# Patient Record
Sex: Female | Born: 1945 | Race: White | Hispanic: No | Marital: Married | Smoking: Never smoker
Health system: Southern US, Community
[De-identification: ages and names within clinical notes are randomized; demographics above are authoritative.]

## PROBLEM LIST (undated history)

## (undated) DIAGNOSIS — I480 Paroxysmal atrial fibrillation: Secondary | ICD-10-CM

## (undated) DIAGNOSIS — M8580 Other specified disorders of bone density and structure, unspecified site: Secondary | ICD-10-CM

## (undated) DIAGNOSIS — E7800 Pure hypercholesterolemia, unspecified: Secondary | ICD-10-CM

## (undated) DIAGNOSIS — E559 Vitamin D deficiency, unspecified: Secondary | ICD-10-CM

## (undated) HISTORY — DX: Pure hypercholesterolemia, unspecified: E78.00

## (undated) HISTORY — DX: Paroxysmal atrial fibrillation: I48.0

## (undated) HISTORY — DX: Other specified disorders of bone density and structure, unspecified site: M85.80

## (undated) HISTORY — DX: Vitamin D deficiency, unspecified: E55.9

---

## 1998-06-16 ENCOUNTER — Other Ambulatory Visit: Admission: RE | Admit: 1998-06-16 | Discharge: 1998-06-16 | Payer: Self-pay | Admitting: *Deleted

## 1998-12-18 ENCOUNTER — Other Ambulatory Visit: Admission: RE | Admit: 1998-12-18 | Discharge: 1998-12-18 | Payer: Self-pay | Admitting: *Deleted

## 1999-11-03 ENCOUNTER — Encounter: Payer: Self-pay | Admitting: *Deleted

## 1999-11-03 ENCOUNTER — Ambulatory Visit (HOSPITAL_COMMUNITY): Admission: RE | Admit: 1999-11-03 | Discharge: 1999-11-03 | Payer: Self-pay | Admitting: *Deleted

## 1999-11-16 ENCOUNTER — Other Ambulatory Visit: Admission: RE | Admit: 1999-11-16 | Discharge: 1999-11-16 | Payer: Self-pay | Admitting: *Deleted

## 2001-02-05 ENCOUNTER — Other Ambulatory Visit: Admission: RE | Admit: 2001-02-05 | Discharge: 2001-02-05 | Payer: Self-pay | Admitting: *Deleted

## 2001-07-02 ENCOUNTER — Ambulatory Visit (HOSPITAL_COMMUNITY): Admission: RE | Admit: 2001-07-02 | Discharge: 2001-07-02 | Payer: Self-pay | Admitting: *Deleted

## 2001-07-02 ENCOUNTER — Encounter: Payer: Self-pay | Admitting: *Deleted

## 2002-01-25 ENCOUNTER — Other Ambulatory Visit: Admission: RE | Admit: 2002-01-25 | Discharge: 2002-01-25 | Payer: Self-pay | Admitting: *Deleted

## 2004-02-17 ENCOUNTER — Other Ambulatory Visit: Admission: RE | Admit: 2004-02-17 | Discharge: 2004-02-17 | Payer: Self-pay | Admitting: Obstetrics & Gynecology

## 2004-02-17 ENCOUNTER — Ambulatory Visit (HOSPITAL_COMMUNITY): Admission: RE | Admit: 2004-02-17 | Discharge: 2004-02-17 | Payer: Self-pay | Admitting: Obstetrics & Gynecology

## 2006-06-20 ENCOUNTER — Ambulatory Visit (HOSPITAL_COMMUNITY): Admission: RE | Admit: 2006-06-20 | Discharge: 2006-06-20 | Payer: Self-pay | Admitting: Obstetrics & Gynecology

## 2007-08-30 ENCOUNTER — Ambulatory Visit (HOSPITAL_COMMUNITY): Admission: RE | Admit: 2007-08-30 | Discharge: 2007-08-30 | Payer: Self-pay | Admitting: Obstetrics & Gynecology

## 2008-09-10 ENCOUNTER — Ambulatory Visit (HOSPITAL_COMMUNITY): Admission: RE | Admit: 2008-09-10 | Discharge: 2008-09-10 | Payer: Self-pay | Admitting: Obstetrics & Gynecology

## 2010-06-24 ENCOUNTER — Ambulatory Visit (HOSPITAL_COMMUNITY): Admission: RE | Admit: 2010-06-24 | Discharge: 2010-06-24 | Payer: Self-pay | Admitting: Obstetrics & Gynecology

## 2012-08-23 ENCOUNTER — Other Ambulatory Visit (HOSPITAL_COMMUNITY): Payer: Self-pay | Admitting: Internal Medicine

## 2012-08-23 DIAGNOSIS — Z1231 Encounter for screening mammogram for malignant neoplasm of breast: Secondary | ICD-10-CM

## 2012-08-28 DIAGNOSIS — Z1151 Encounter for screening for human papillomavirus (HPV): Secondary | ICD-10-CM | POA: Diagnosis not present

## 2012-08-28 DIAGNOSIS — Z124 Encounter for screening for malignant neoplasm of cervix: Secondary | ICD-10-CM | POA: Diagnosis not present

## 2012-08-28 DIAGNOSIS — M949 Disorder of cartilage, unspecified: Secondary | ICD-10-CM | POA: Diagnosis not present

## 2012-08-28 DIAGNOSIS — M899 Disorder of bone, unspecified: Secondary | ICD-10-CM | POA: Diagnosis not present

## 2012-08-28 DIAGNOSIS — Z01419 Encounter for gynecological examination (general) (routine) without abnormal findings: Secondary | ICD-10-CM | POA: Diagnosis not present

## 2012-08-28 DIAGNOSIS — N951 Menopausal and female climacteric states: Secondary | ICD-10-CM | POA: Diagnosis not present

## 2012-09-11 ENCOUNTER — Ambulatory Visit (HOSPITAL_COMMUNITY)
Admission: RE | Admit: 2012-09-11 | Discharge: 2012-09-11 | Disposition: A | Discharge status: Home / Self Care | Payer: Medicare Other | Source: Ambulatory Visit | Attending: Internal Medicine | Admitting: Internal Medicine

## 2012-09-11 DIAGNOSIS — Z1231 Encounter for screening mammogram for malignant neoplasm of breast: Secondary | ICD-10-CM | POA: Insufficient documentation

## 2012-11-20 DIAGNOSIS — K5520 Angiodysplasia of colon without hemorrhage: Secondary | ICD-10-CM | POA: Diagnosis not present

## 2012-11-20 DIAGNOSIS — K648 Other hemorrhoids: Secondary | ICD-10-CM | POA: Diagnosis not present

## 2012-11-20 DIAGNOSIS — Z1211 Encounter for screening for malignant neoplasm of colon: Secondary | ICD-10-CM | POA: Diagnosis not present

## 2013-04-30 DIAGNOSIS — D485 Neoplasm of uncertain behavior of skin: Secondary | ICD-10-CM | POA: Diagnosis not present

## 2013-04-30 DIAGNOSIS — L820 Inflamed seborrheic keratosis: Secondary | ICD-10-CM | POA: Diagnosis not present

## 2013-07-18 DIAGNOSIS — L570 Actinic keratosis: Secondary | ICD-10-CM | POA: Diagnosis not present

## 2013-07-18 DIAGNOSIS — D235 Other benign neoplasm of skin of trunk: Secondary | ICD-10-CM | POA: Diagnosis not present

## 2013-07-30 DIAGNOSIS — Z23 Encounter for immunization: Secondary | ICD-10-CM | POA: Diagnosis not present

## 2013-08-28 DIAGNOSIS — L219 Seborrheic dermatitis, unspecified: Secondary | ICD-10-CM | POA: Diagnosis not present

## 2013-08-28 DIAGNOSIS — L570 Actinic keratosis: Secondary | ICD-10-CM | POA: Diagnosis not present

## 2014-05-07 DIAGNOSIS — H02839 Dermatochalasis of unspecified eye, unspecified eyelid: Secondary | ICD-10-CM | POA: Diagnosis not present

## 2014-05-07 DIAGNOSIS — H2510 Age-related nuclear cataract, unspecified eye: Secondary | ICD-10-CM | POA: Diagnosis not present

## 2014-05-16 DIAGNOSIS — T148 Other injury of unspecified body region: Secondary | ICD-10-CM | POA: Diagnosis not present

## 2014-05-16 DIAGNOSIS — W57XXXA Bitten or stung by nonvenomous insect and other nonvenomous arthropods, initial encounter: Secondary | ICD-10-CM | POA: Diagnosis not present

## 2014-05-16 DIAGNOSIS — A7740 Ehrlichiosis, unspecified: Secondary | ICD-10-CM | POA: Diagnosis not present

## 2014-07-16 DIAGNOSIS — D235 Other benign neoplasm of skin of trunk: Secondary | ICD-10-CM | POA: Diagnosis not present

## 2014-07-16 DIAGNOSIS — L819 Disorder of pigmentation, unspecified: Secondary | ICD-10-CM | POA: Diagnosis not present

## 2014-08-11 DIAGNOSIS — Z23 Encounter for immunization: Secondary | ICD-10-CM | POA: Diagnosis not present

## 2015-07-22 DIAGNOSIS — L821 Other seborrheic keratosis: Secondary | ICD-10-CM | POA: Diagnosis not present

## 2015-07-22 DIAGNOSIS — L820 Inflamed seborrheic keratosis: Secondary | ICD-10-CM | POA: Diagnosis not present

## 2015-07-22 DIAGNOSIS — L814 Other melanin hyperpigmentation: Secondary | ICD-10-CM | POA: Diagnosis not present

## 2015-07-22 DIAGNOSIS — D225 Melanocytic nevi of trunk: Secondary | ICD-10-CM | POA: Diagnosis not present

## 2015-07-22 DIAGNOSIS — L570 Actinic keratosis: Secondary | ICD-10-CM | POA: Diagnosis not present

## 2015-07-30 DIAGNOSIS — Z1231 Encounter for screening mammogram for malignant neoplasm of breast: Secondary | ICD-10-CM | POA: Diagnosis not present

## 2015-07-30 DIAGNOSIS — Z803 Family history of malignant neoplasm of breast: Secondary | ICD-10-CM | POA: Diagnosis not present

## 2015-08-12 ENCOUNTER — Emergency Department (HOSPITAL_COMMUNITY)
Admission: EM | Admit: 2015-08-12 | Discharge: 2015-08-12 | Disposition: A | Discharge status: Home / Self Care | Payer: Medicare Other | Attending: Emergency Medicine | Admitting: Emergency Medicine

## 2015-08-12 ENCOUNTER — Emergency Department (HOSPITAL_COMMUNITY): Payer: Medicare Other

## 2015-08-12 ENCOUNTER — Encounter (HOSPITAL_COMMUNITY): Payer: Self-pay | Admitting: *Deleted

## 2015-08-12 DIAGNOSIS — Y9389 Activity, other specified: Secondary | ICD-10-CM | POA: Insufficient documentation

## 2015-08-12 DIAGNOSIS — Y9289 Other specified places as the place of occurrence of the external cause: Secondary | ICD-10-CM | POA: Diagnosis not present

## 2015-08-12 DIAGNOSIS — Y998 Other external cause status: Secondary | ICD-10-CM | POA: Insufficient documentation

## 2015-08-12 DIAGNOSIS — W108XXA Fall (on) (from) other stairs and steps, initial encounter: Secondary | ICD-10-CM | POA: Diagnosis not present

## 2015-08-12 DIAGNOSIS — M7989 Other specified soft tissue disorders: Secondary | ICD-10-CM | POA: Diagnosis not present

## 2015-08-12 DIAGNOSIS — S5011XA Contusion of right forearm, initial encounter: Secondary | ICD-10-CM | POA: Diagnosis not present

## 2015-08-12 DIAGNOSIS — S59911A Unspecified injury of right forearm, initial encounter: Secondary | ICD-10-CM | POA: Diagnosis present

## 2015-08-12 NOTE — Discharge Instructions (Signed)
Contusion A contusion is a deep bruise. Contusions are the result of a blunt injury to tissues and muscle fibers under the skin. The injury causes bleeding under the skin. The skin overlying the contusion may turn blue, purple, or yellow. Minor injuries will give you a painless contusion, but more severe contusions may stay painful and swollen for a few weeks.  CAUSES  This condition is usually caused by a blow, trauma, or direct force to an area of the body. SYMPTOMS  Symptoms of this condition include:  Swelling of the injured area.  Pain and tenderness in the injured area.  Discoloration. The area may have redness and then turn blue, purple, or yellow. DIAGNOSIS  This condition is diagnosed based on a physical exam and medical history. An X-ray, CT scan, or MRI may be needed to determine if there are any associated injuries, such as broken bones (fractures). TREATMENT  Specific treatment for this condition depends on what area of the body was injured. In general, the best treatment for a contusion is resting, icing, applying pressure to (compression), and elevating the injured area. This is often called the RICE strategy. Over-the-counter anti-inflammatory medicines may also be recommended for pain control.  HOME CARE INSTRUCTIONS   Rest the injured area.  If directed, apply ice to the injured area:  Put ice in a plastic bag.  Place a towel between your skin and the bag.  Leave the ice on for 20 minutes, 2-3 times per day.  If directed, apply light compression to the injured area using an elastic bandage. Make sure the bandage is not wrapped too tightly. Remove and reapply the bandage as directed by your health care provider.  If possible, raise (elevate) the injured area above the level of your heart while you are sitting or lying down.  Take over-the-counter and prescription medicines only as told by your health care provider. SEEK MEDICAL CARE IF:  Your symptoms do not  improve after several days of treatment.  Your symptoms get worse.  You have difficulty moving the injured area. SEEK IMMEDIATE MEDICAL CARE IF:   You have severe pain.  You have numbness in a hand or foot.  Your hand or foot turns pale or cold.   This information is not intended to replace advice given to you by your health care provider. Make sure you discuss any questions you have with your health care provider.   Document Released: 07/20/2005 Document Revised: 07/01/2015 Document Reviewed: 02/25/2015 Elsevier Interactive Patient Education 2016 [employer] Hematoma A hematoma is a collection of blood under the skin, in an organ, in a body space, in a joint space, or in other tissue. The blood can clot to form a lump that you can see and feel. The lump is often firm and may sometimes become sore and tender. Most hematomas get better in a few days to weeks. However, some hematomas may be serious and require medical care. Hematomas can range in size from very small to very large. CAUSES  A hematoma can be caused by a blunt or penetrating injury. It can also be caused by spontaneous leakage from a blood vessel under the skin. Spontaneous leakage from a blood vessel is more likely to occur in older people, especially those taking blood thinners. Sometimes, a hematoma can develop after certain medical procedures. SIGNS AND SYMPTOMS   A firm lump on the body.  Possible pain and tenderness in the area.  Bruising.Blue, dark blue, purple-red, or yellowish skin may appear  at the site of the hematoma if the hematoma is close to the surface of the skin. For hematomas in deeper tissues or body spaces, the signs and symptoms may be subtle. For example, an intra-abdominal hematoma may cause abdominal pain, weakness, fainting, and shortness of breath. An intracranial hematoma may cause a headache or symptoms such as weakness, trouble speaking, or a change in consciousness. DIAGNOSIS  A hematoma  can usually be diagnosed based on your medical history and a physical exam. Imaging tests may be needed if your health care provider suspects a hematoma in deeper tissues or body spaces, such as the abdomen, head, or chest. These tests may include ultrasonography or a CT scan.  TREATMENT  Hematomas usually go away on their own over time. Rarely does the blood need to be drained out of the body. Large hematomas or those that may affect vital organs will sometimes need surgical drainage or monitoring. HOME CARE INSTRUCTIONS   Apply ice to the injured area:   Put ice in a plastic bag.   Place a towel between your skin and the bag.   Leave the ice on for 20 minutes, 2-3 times a day for the first 1 to 2 days.   After the first 2 days, switch to using warm compresses on the hematoma.   Elevate the injured area to help decrease pain and swelling. Wrapping the area with an elastic bandage may also be helpful. Compression helps to reduce swelling and promotes shrinking of the hematoma. Make sure the bandage is not wrapped too tight.   If your hematoma is on a lower extremity and is painful, crutches may be helpful for a couple days.   Only take over-the-counter or prescription medicines as directed by your health care provider. SEEK IMMEDIATE MEDICAL CARE IF:   You have increasing pain, or your pain is not controlled with medicine.   You have a fever.   You have worsening swelling or discoloration.   Your skin over the hematoma breaks or starts bleeding.   Your hematoma is in your chest or abdomen and you have weakness, shortness of breath, or a change in consciousness.  Your hematoma is on your scalp (caused by a fall or injury) and you have a worsening headache or a change in alertness or consciousness. MAKE SURE YOU:   Understand these instructions.  Will watch your condition.  Will get help right away if you are not doing well or get worse.   This information is not  intended to replace advice given to you by your health care provider. Make sure you discuss any questions you have with your health care provider.   Document Released: 05/24/2004 Document Revised: 06/12/2013 Document Reviewed: 03/20/2013 Elsevier Interactive Patient Education [employer].

## 2015-08-12 NOTE — ED Notes (Signed)
Pt reports no medical hx, but has a tendency to bruise. Pt was sitting on outside steps, when to stand up, heel slipped. Fell to the right and right forearm landed on brick step. Pt now has large purple hematoma, egg sized, to upper right forearm near elbow. Pain 2/10. Does not take aspirin. Able to wiggle all fingers. Denies numbness or tingling in hand.

## 2015-08-12 NOTE — ED Notes (Signed)
Patient transported to X-ray 

## 2015-08-12 NOTE — ED Provider Notes (Signed)
CSN: [id number]     Arrival date & time 08/12/15  1555 History   First MD Initiated Contact with Patient 08/12/15 1615     Chief Complaint  Patient presents with  . Fall  . Arm Injury   Patient is a 69 y.o. female presenting with fall and arm injury. The history is provided by the patient.  Fall This is a new problem. The current episode started less than 1 hour ago. Pertinent negatives include no chest pain and no headaches.  Arm Injury Pain details:    Quality:  Dull   Radiates to:  R arm and R forearm   Severity:  Mild   Onset quality:  Sudden   Timing:  Constant Chronicity:  New Dislocation: no   Foreign body present:  No foreign bodies Relieved by:  Nothing Worsened by:  Nothing tried Associated symptoms: swelling   Associated symptoms: no back pain, no fever, no muscle weakness, no neck pain and no stiffness    the patient fell while walking down the steps. Her heel slipped and her right forearm struck the edge of a brick step. Patient developed a large hematoma on the right upper forearm. She denies any other injuries. No numbness or weakness. No neck pain or headache.   History reviewed. No pertinent past medical history. History reviewed. No pertinent past surgical history. History reviewed. No pertinent family history. Social History  Substance Use Topics  . Smoking status: Never Smoker   . Smokeless tobacco: None  . Alcohol Use: No   OB History    No data available     Review of Systems  Constitutional: Negative for fever.  Cardiovascular: Negative for chest pain.  Gastrointestinal: Negative for vomiting.  Musculoskeletal: Negative for back pain, stiffness, neck pain and neck stiffness.  Neurological: Negative for headaches.      Allergies  Review of patient's allergies indicates no known allergies.  Home Medications   Prior to Admission medications   Not on File   BP 154/74 mmHg  Pulse 75  Temp(Src) 98.3 F (36.8 C) (Oral)  Resp 20  Ht 5\' 3"   (1.6 m)  Wt 123 lb (55.792 kg)  BMI 21.79 kg/m2  SpO2 100% Physical Exam  Constitutional: She appears well-developed and well-nourished. No distress.  HENT:  Head: Normocephalic and atraumatic.  Right Ear: External ear normal.  Left Ear: External ear normal.  Eyes: Conjunctivae are normal. Right eye exhibits no discharge. Left eye exhibits no discharge. No scleral icterus.  Neck: Neck supple. No tracheal deviation present.  Cardiovascular: Normal rate.   Pulmonary/Chest: Effort normal. No stridor. No respiratory distress.  Musculoskeletal:       Right forearm: She exhibits swelling, edema and deformity.  Large hematoma proximal right forearm, no laceration, small skin abrasion, full range of motion of elbow, no shoulder tenderness, no wrist tenderness, distal neurovascular intact, strong pulse  Neurological: She is alert. Cranial nerve deficit: no gross deficits.  Skin: Skin is warm and dry. No rash noted.  Psychiatric: She has a normal mood and affect.  Nursing note and vitals reviewed.   ED Course  Procedures (including critical care time)   Imaging Review Dg Elbow Complete Right  08/12/2015  CLINICAL DATA:  Fall, right forearm hematoma EXAM: RIGHT ELBOW - COMPLETE 3+ VIEW COMPARISON:  None FINDINGS: Four views of the right elbow submitted. No acute fracture or subluxation. No posterior fat pad sign. Soft tissue swelling noted dorsal aspect proximal forearm. IMPRESSION: No acute fracture or subluxation. Soft  tissue swelling noted dorsal aspect proximal forearm. Electronically Signed   By: Lahoma Crocker M.D.   On: 08/12/2015 16:57   Dg Forearm Right  08/12/2015  CLINICAL DATA:  Pt was sitting on outside steps, went to stand up, heel slipped. Fell to the right and right forearm landed on brick step. Pt now has large purple hematoma, egg sized, to upper right forearm near elbow. Pain around upper forearm and elbow. Initial encounter. EXAM: RIGHT FOREARM - 2 VIEW COMPARISON:  Elbow films  of same date FINDINGS: Soft tissue swelling about the proximal ulna is moderate. No acute fracture or dislocation. No elbow joint effusion. IMPRESSION: Soft tissue swelling, without acute osseous finding. Electronically Signed   By: Abigail Miyamoto M.D.   On: 08/12/2015 17:03   I have personally reviewed and evaluated these images and lab results as part of my medical decision-making.    MDM   Final diagnoses:  Traumatic hematoma of forearm, right, initial encounter    Patient has a large hematoma of her right forearm. The x-rays were reviewed with the patient. There is no evidence of fracture. Hematoma does appear slightly larger in size than  initially. Compartments are soft.  Full range of motion in her wrist and fingers.  Strong radial pulse with normal sensation.  Doubt compartment syndrome.  Ice, and ace wrap.   Would not drain the hematoma at this time.  Family will follow up with orthopedist.     Dorie Rank, MD 08/12/15 1739

## 2015-08-12 NOTE — ED Notes (Signed)
MD at bedside. 

## 2015-08-14 DIAGNOSIS — M25521 Pain in right elbow: Secondary | ICD-10-CM | POA: Diagnosis not present

## 2015-08-19 DIAGNOSIS — Z23 Encounter for immunization: Secondary | ICD-10-CM | POA: Diagnosis not present

## 2015-08-31 DIAGNOSIS — T148 Other injury of unspecified body region: Secondary | ICD-10-CM | POA: Diagnosis not present

## 2015-08-31 DIAGNOSIS — E7800 Pure hypercholesterolemia, unspecified: Secondary | ICD-10-CM | POA: Diagnosis not present

## 2015-08-31 DIAGNOSIS — Z1389 Encounter for screening for other disorder: Secondary | ICD-10-CM | POA: Diagnosis not present

## 2015-08-31 DIAGNOSIS — Z01419 Encounter for gynecological examination (general) (routine) without abnormal findings: Secondary | ICD-10-CM | POA: Diagnosis not present

## 2015-08-31 DIAGNOSIS — Z0000 Encounter for general adult medical examination without abnormal findings: Secondary | ICD-10-CM | POA: Diagnosis not present

## 2015-08-31 DIAGNOSIS — M8588 Other specified disorders of bone density and structure, other site: Secondary | ICD-10-CM | POA: Diagnosis not present

## 2015-08-31 DIAGNOSIS — Z124 Encounter for screening for malignant neoplasm of cervix: Secondary | ICD-10-CM | POA: Diagnosis not present

## 2015-12-20 IMAGING — CR DG ELBOW COMPLETE 3+V*R*
4 series · 4 of 4 positions shown · non-contrast
Comparison: None

CLINICAL DATA: Fall, right forearm hematoma

EXAM:
RIGHT ELBOW - COMPLETE 3+ VIEW

[x elbow ap right]
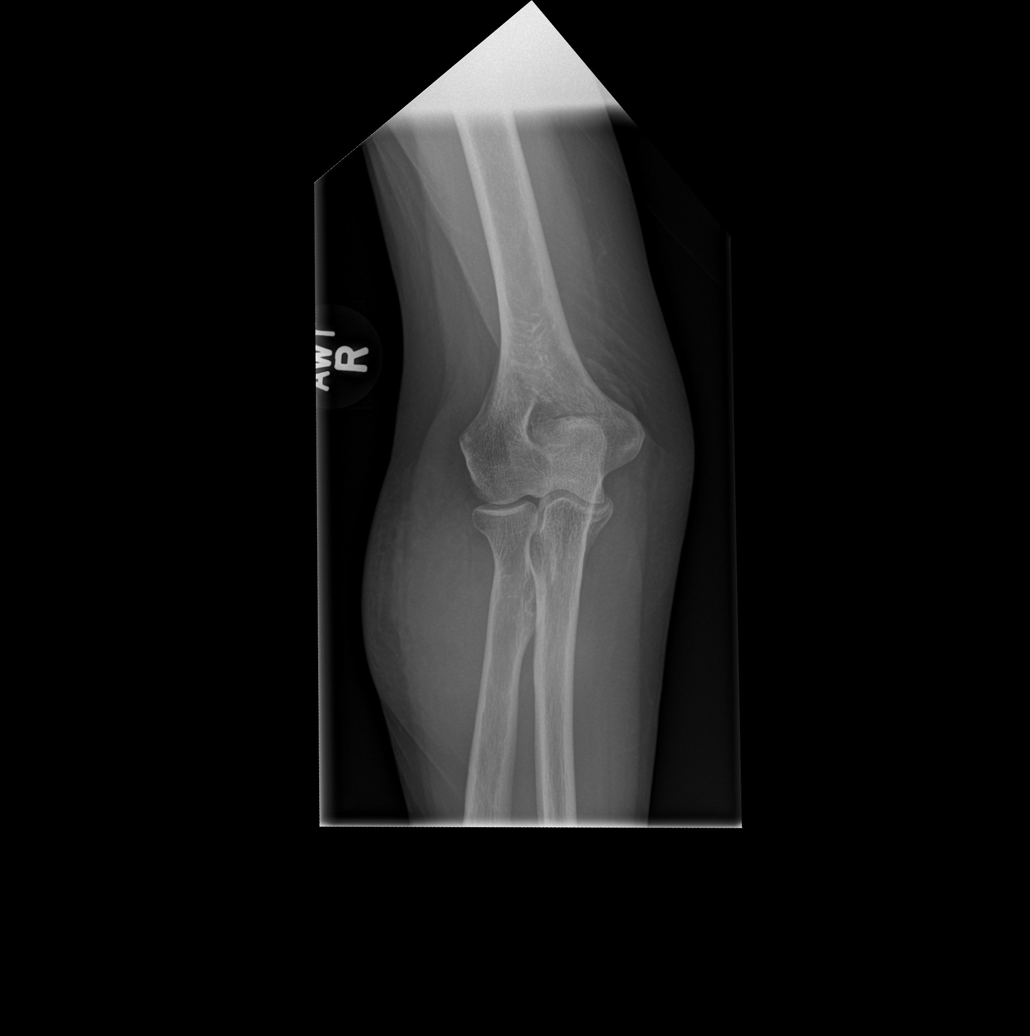

[x elbow obl right (1 of 2)]
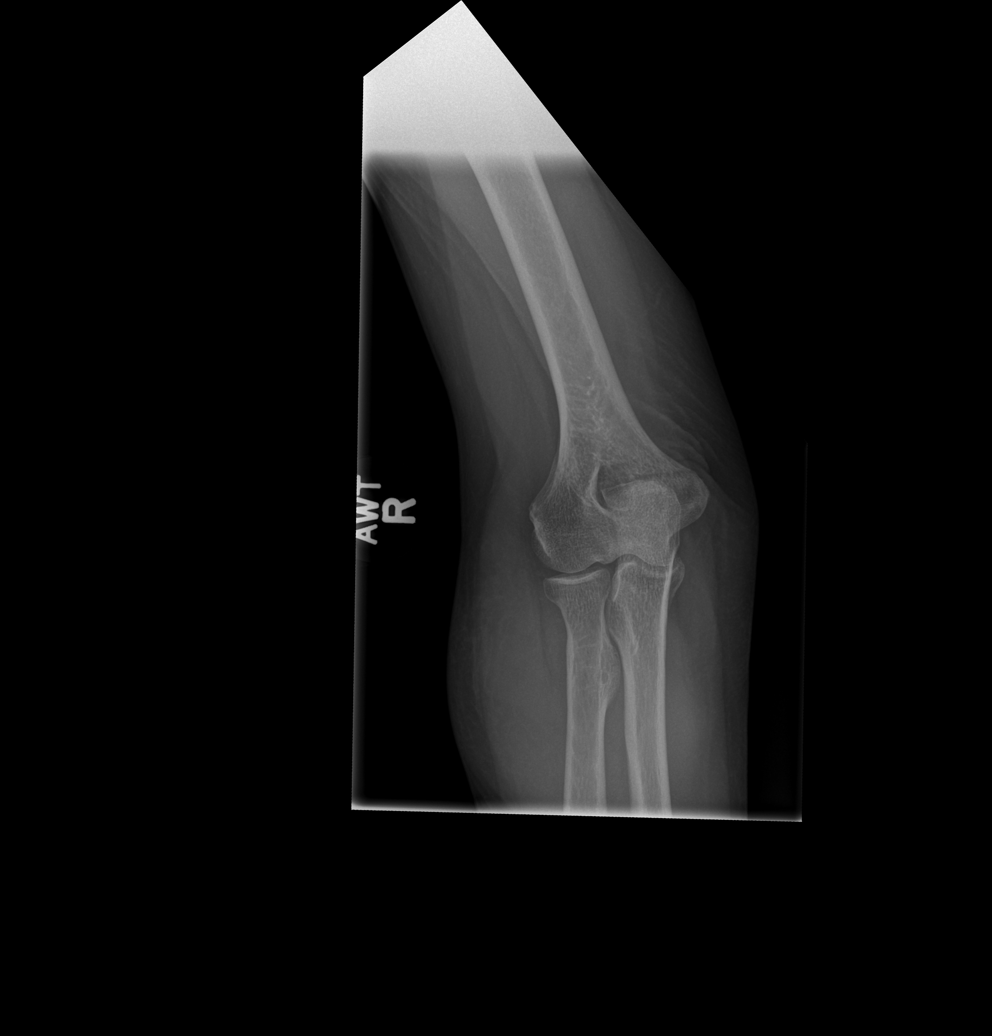

[x elbow obl right (2 of 2)]
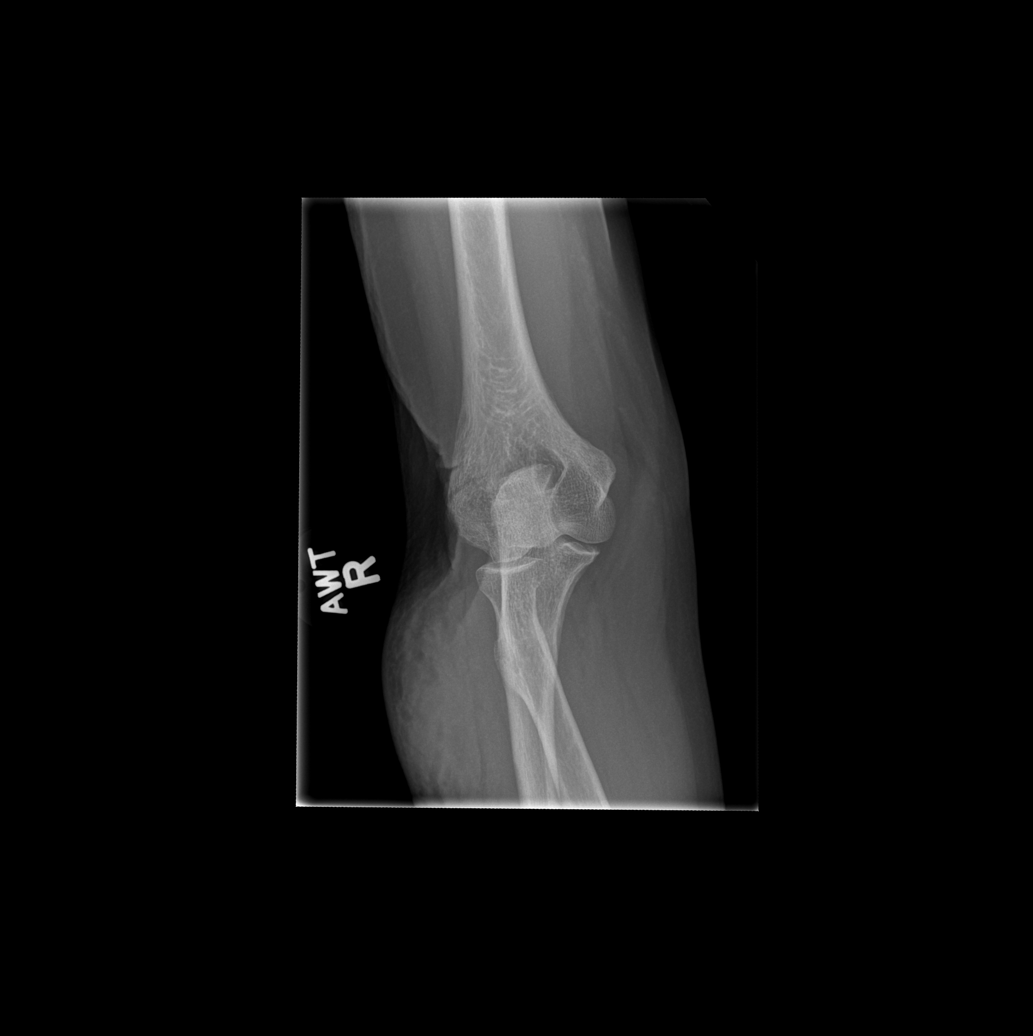

[x elbow lat right]
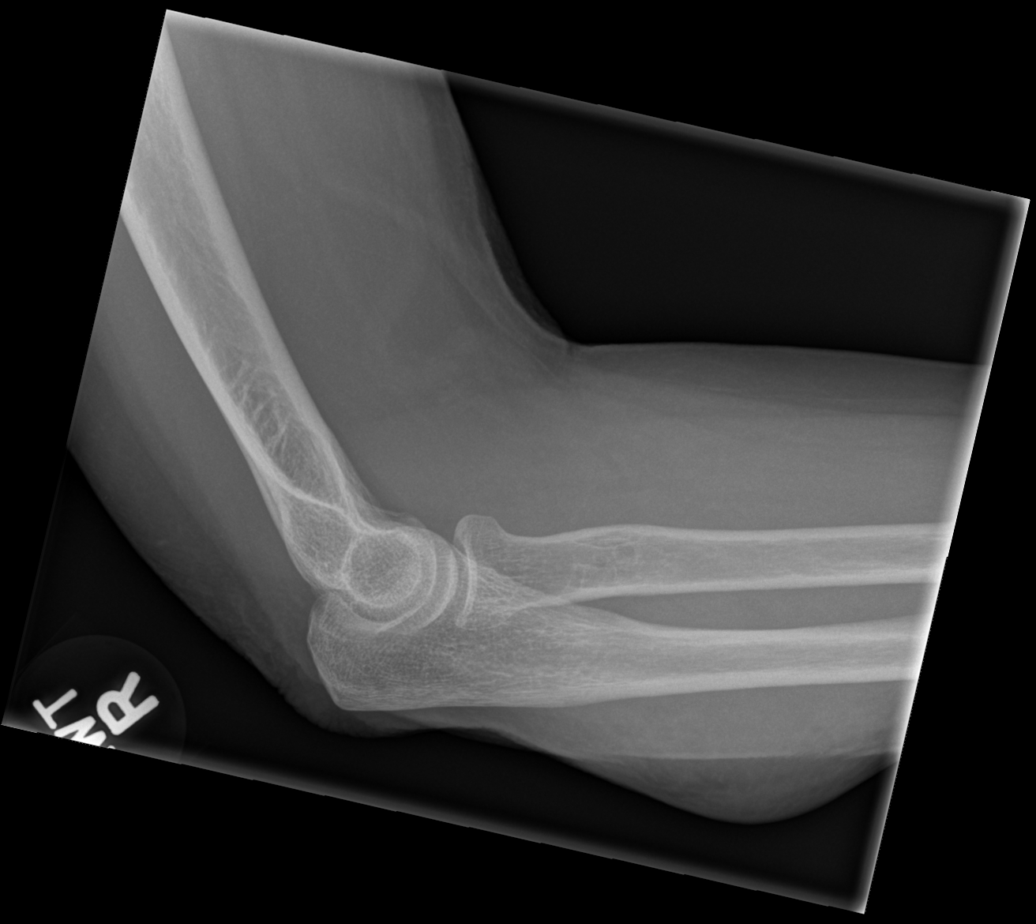

[4 of 4 positions shown; findings below may reference images not displayed]

FINDINGS: Four views of the right elbow submitted. No acute fracture or
subluxation. No posterior fat pad sign. Soft tissue swelling noted
dorsal aspect proximal forearm.
IMPRESSION: No acute fracture or subluxation. Soft tissue swelling noted dorsal
aspect proximal forearm.

## 2016-05-13 DIAGNOSIS — H2513 Age-related nuclear cataract, bilateral: Secondary | ICD-10-CM | POA: Diagnosis not present

## 2016-07-25 DIAGNOSIS — Z23 Encounter for immunization: Secondary | ICD-10-CM | POA: Diagnosis not present

## 2017-07-25 DIAGNOSIS — Z23 Encounter for immunization: Secondary | ICD-10-CM | POA: Diagnosis not present

## 2017-12-25 DIAGNOSIS — E7800 Pure hypercholesterolemia, unspecified: Secondary | ICD-10-CM | POA: Diagnosis not present

## 2017-12-25 DIAGNOSIS — Z1389 Encounter for screening for other disorder: Secondary | ICD-10-CM | POA: Diagnosis not present

## 2017-12-25 DIAGNOSIS — Z23 Encounter for immunization: Secondary | ICD-10-CM | POA: Diagnosis not present

## 2017-12-25 DIAGNOSIS — Z1231 Encounter for screening mammogram for malignant neoplasm of breast: Secondary | ICD-10-CM | POA: Diagnosis not present

## 2017-12-25 DIAGNOSIS — Z0000 Encounter for general adult medical examination without abnormal findings: Secondary | ICD-10-CM | POA: Diagnosis not present

## 2017-12-25 DIAGNOSIS — M8588 Other specified disorders of bone density and structure, other site: Secondary | ICD-10-CM | POA: Diagnosis not present

## 2017-12-25 DIAGNOSIS — R636 Underweight: Secondary | ICD-10-CM | POA: Diagnosis not present

## 2017-12-25 DIAGNOSIS — Z7189 Other specified counseling: Secondary | ICD-10-CM | POA: Diagnosis not present

## 2017-12-25 DIAGNOSIS — Z1239 Encounter for other screening for malignant neoplasm of breast: Secondary | ICD-10-CM | POA: Diagnosis not present

## 2018-04-23 DIAGNOSIS — M5432 Sciatica, left side: Secondary | ICD-10-CM | POA: Diagnosis not present

## 2018-06-28 DIAGNOSIS — Z23 Encounter for immunization: Secondary | ICD-10-CM | POA: Diagnosis not present

## 2019-06-25 DIAGNOSIS — Z23 Encounter for immunization: Secondary | ICD-10-CM | POA: Diagnosis not present

## 2020-02-27 ENCOUNTER — Ambulatory Visit: Payer: Medicare Other | Attending: Internal Medicine

## 2020-02-27 DIAGNOSIS — Z23 Encounter for immunization: Secondary | ICD-10-CM

## 2020-02-27 NOTE — Progress Notes (Signed)
   Covid-19 Vaccination Clinic  Name:  [name]    MRN: [id number] DOB: Jun 23, 1946  02/27/2020  [name] was observed post Covid-19 immunization for 15 minutes without incident. She was provided with Vaccine Information Sheet and instruction to access the V-Safe system.   [name] was instructed to call 911 with any severe reactions post vaccine: Marland Kitchen Difficulty breathing  . Swelling of face and throat  . A fast heartbeat  . A bad rash all over body  . Dizziness and weakness   Immunizations Administered    Name Date Dose VIS Date Route   Moderna COVID-19 Vaccine 02/27/2020  8:51 AM 0.5 mL 09/2019 Intramuscular   Manufacturer: Moderna   Lot: [id number]   [id number]

## 2020-03-26 ENCOUNTER — Ambulatory Visit: Payer: Medicare Other | Attending: Internal Medicine

## 2020-03-26 DIAGNOSIS — Z23 Encounter for immunization: Secondary | ICD-10-CM

## 2020-03-26 NOTE — Progress Notes (Signed)
   Covid-19 Vaccination Clinic  Name:  ADLEIGH MONTIEL    MRN: [id number] DOB: July 15, 1946  03/26/2020  Ms. Shivley was observed post Covid-19 immunization for 15 minutes without incident. She was provided with Vaccine Information Sheet and instruction to access the V-Safe system.   Ms. Bartkus was instructed to call 911 with any severe reactions post vaccine: Marland Kitchen Difficulty breathing  . Swelling of face and throat  . A fast heartbeat  . A bad rash all over body  . Dizziness and weakness   Immunizations Administered    Name Date Dose VIS Date Route   Moderna COVID-19 Vaccine 03/26/2020  8:49 AM 0.5 mL 09/2019 Intramuscular   Manufacturer: Moderna   Lot: [id number]   [id number]

## 2020-07-01 DIAGNOSIS — Z23 Encounter for immunization: Secondary | ICD-10-CM | POA: Diagnosis not present

## 2020-10-01 ENCOUNTER — Ambulatory Visit: Payer: Medicare Other | Attending: Internal Medicine

## 2020-10-01 DIAGNOSIS — Z23 Encounter for immunization: Secondary | ICD-10-CM

## 2020-10-01 NOTE — Progress Notes (Signed)
   Covid-19 Vaccination Clinic  Name:  AIYA KEACH    MRN: [id number] DOB: [birth year]  10/01/2020  Ms. Mierzejewski was observed post Covid-19 immunization for 15 minutes without incident. She was provided with Vaccine Information Sheet and instruction to access the V-Safe system.   Ms. Dayrit was instructed to call 911 with any severe reactions post vaccine: Marland Kitchen Difficulty breathing  . Swelling of face and throat  . A fast heartbeat  . A bad rash all over body  . Dizziness and weakness   Immunizations Administered    No immunizations on file.

## 2021-02-22 DIAGNOSIS — Z23 Encounter for immunization: Secondary | ICD-10-CM | POA: Diagnosis not present

## 2021-04-27 DIAGNOSIS — E7800 Pure hypercholesterolemia, unspecified: Secondary | ICD-10-CM | POA: Diagnosis not present

## 2021-04-27 DIAGNOSIS — Z1211 Encounter for screening for malignant neoplasm of colon: Secondary | ICD-10-CM | POA: Diagnosis not present

## 2021-04-27 DIAGNOSIS — Z0000 Encounter for general adult medical examination without abnormal findings: Secondary | ICD-10-CM | POA: Diagnosis not present

## 2021-04-27 DIAGNOSIS — Z1389 Encounter for screening for other disorder: Secondary | ICD-10-CM | POA: Diagnosis not present

## 2021-04-27 DIAGNOSIS — M8588 Other specified disorders of bone density and structure, other site: Secondary | ICD-10-CM | POA: Diagnosis not present

## 2021-04-27 DIAGNOSIS — R636 Underweight: Secondary | ICD-10-CM | POA: Diagnosis not present

## 2021-04-29 DIAGNOSIS — Z1211 Encounter for screening for malignant neoplasm of colon: Secondary | ICD-10-CM | POA: Diagnosis not present

## 2021-06-29 DIAGNOSIS — Z23 Encounter for immunization: Secondary | ICD-10-CM | POA: Diagnosis not present

## 2021-07-14 DIAGNOSIS — Z23 Encounter for immunization: Secondary | ICD-10-CM | POA: Diagnosis not present

## 2021-11-24 DIAGNOSIS — M654 Radial styloid tenosynovitis [de Quervain]: Secondary | ICD-10-CM | POA: Diagnosis not present

## 2022-05-19 DIAGNOSIS — Z1239 Encounter for other screening for malignant neoplasm of breast: Secondary | ICD-10-CM | POA: Diagnosis not present

## 2022-05-19 DIAGNOSIS — Z1159 Encounter for screening for other viral diseases: Secondary | ICD-10-CM | POA: Diagnosis not present

## 2022-05-19 DIAGNOSIS — Z23 Encounter for immunization: Secondary | ICD-10-CM | POA: Diagnosis not present

## 2022-05-19 DIAGNOSIS — Z0000 Encounter for general adult medical examination without abnormal findings: Secondary | ICD-10-CM | POA: Diagnosis not present

## 2022-05-19 DIAGNOSIS — Z1331 Encounter for screening for depression: Secondary | ICD-10-CM | POA: Diagnosis not present

## 2022-05-19 DIAGNOSIS — M8588 Other specified disorders of bone density and structure, other site: Secondary | ICD-10-CM | POA: Diagnosis not present

## 2022-05-19 DIAGNOSIS — E7800 Pure hypercholesterolemia, unspecified: Secondary | ICD-10-CM | POA: Diagnosis not present

## 2022-05-19 DIAGNOSIS — E559 Vitamin D deficiency, unspecified: Secondary | ICD-10-CM | POA: Diagnosis not present

## 2022-06-28 DIAGNOSIS — Z23 Encounter for immunization: Secondary | ICD-10-CM | POA: Diagnosis not present

## 2022-07-15 DIAGNOSIS — Z23 Encounter for immunization: Secondary | ICD-10-CM | POA: Diagnosis not present

## 2022-10-13 DIAGNOSIS — J101 Influenza due to other identified influenza virus with other respiratory manifestations: Secondary | ICD-10-CM | POA: Diagnosis not present

## 2022-10-13 DIAGNOSIS — J029 Acute pharyngitis, unspecified: Secondary | ICD-10-CM | POA: Diagnosis not present

## 2022-10-13 DIAGNOSIS — Z03818 Encounter for observation for suspected exposure to other biological agents ruled out: Secondary | ICD-10-CM | POA: Diagnosis not present

## 2023-02-23 DIAGNOSIS — Z1231 Encounter for screening mammogram for malignant neoplasm of breast: Secondary | ICD-10-CM | POA: Diagnosis not present

## 2023-06-22 DIAGNOSIS — Z23 Encounter for immunization: Secondary | ICD-10-CM | POA: Diagnosis not present

## 2023-07-04 DIAGNOSIS — Z23 Encounter for immunization: Secondary | ICD-10-CM | POA: Diagnosis not present

## 2024-05-02 DIAGNOSIS — Z1231 Encounter for screening mammogram for malignant neoplasm of breast: Secondary | ICD-10-CM | POA: Diagnosis not present

## 2024-06-25 DIAGNOSIS — Z23 Encounter for immunization: Secondary | ICD-10-CM | POA: Diagnosis not present

## 2024-07-08 DIAGNOSIS — Z23 Encounter for immunization: Secondary | ICD-10-CM | POA: Diagnosis not present

## 2024-08-12 ENCOUNTER — Other Ambulatory Visit: Payer: Self-pay | Admitting: Family Medicine

## 2024-08-12 DIAGNOSIS — R030 Elevated blood-pressure reading, without diagnosis of hypertension: Secondary | ICD-10-CM | POA: Diagnosis not present

## 2024-08-12 DIAGNOSIS — E7800 Pure hypercholesterolemia, unspecified: Secondary | ICD-10-CM | POA: Diagnosis not present

## 2024-08-12 DIAGNOSIS — M799 Soft tissue disorder, unspecified: Secondary | ICD-10-CM | POA: Diagnosis not present

## 2024-08-12 DIAGNOSIS — L989 Disorder of the skin and subcutaneous tissue, unspecified: Secondary | ICD-10-CM | POA: Diagnosis not present

## 2024-08-12 DIAGNOSIS — Z1211 Encounter for screening for malignant neoplasm of colon: Secondary | ICD-10-CM | POA: Diagnosis not present

## 2024-08-12 DIAGNOSIS — M7989 Other specified soft tissue disorders: Secondary | ICD-10-CM

## 2024-08-12 DIAGNOSIS — Z0000 Encounter for general adult medical examination without abnormal findings: Secondary | ICD-10-CM | POA: Diagnosis not present

## 2024-08-12 DIAGNOSIS — Z658 Other specified problems related to psychosocial circumstances: Secondary | ICD-10-CM | POA: Diagnosis not present

## 2024-08-12 DIAGNOSIS — M8588 Other specified disorders of bone density and structure, other site: Secondary | ICD-10-CM | POA: Diagnosis not present

## 2024-08-12 DIAGNOSIS — Z23 Encounter for immunization: Secondary | ICD-10-CM | POA: Diagnosis not present

## 2024-08-12 DIAGNOSIS — E559 Vitamin D deficiency, unspecified: Secondary | ICD-10-CM | POA: Diagnosis not present

## 2024-08-19 ENCOUNTER — Ambulatory Visit
Admission: RE | Admit: 2024-08-19 | Discharge: 2024-08-19 | Disposition: A | Discharge status: Home / Self Care | Source: Ambulatory Visit | Attending: Family Medicine | Admitting: Family Medicine

## 2024-08-19 DIAGNOSIS — M7989 Other specified soft tissue disorders: Secondary | ICD-10-CM

## 2024-08-26 DIAGNOSIS — Z1283 Encounter for screening for malignant neoplasm of skin: Secondary | ICD-10-CM | POA: Diagnosis not present

## 2024-08-26 DIAGNOSIS — D225 Melanocytic nevi of trunk: Secondary | ICD-10-CM | POA: Diagnosis not present

## 2024-08-26 DIAGNOSIS — L821 Other seborrheic keratosis: Secondary | ICD-10-CM | POA: Diagnosis not present

## 2024-09-23 DIAGNOSIS — M7989 Other specified soft tissue disorders: Secondary | ICD-10-CM | POA: Diagnosis not present

## 2024-09-23 DIAGNOSIS — R413 Other amnesia: Secondary | ICD-10-CM | POA: Diagnosis not present

## 2024-09-24 ENCOUNTER — Other Ambulatory Visit: Payer: Self-pay | Admitting: Family Medicine

## 2024-09-24 DIAGNOSIS — M7989 Other specified soft tissue disorders: Secondary | ICD-10-CM

## 2024-09-25 ENCOUNTER — Other Ambulatory Visit: Payer: Self-pay | Admitting: Family Medicine

## 2024-09-25 DIAGNOSIS — R413 Other amnesia: Secondary | ICD-10-CM

## 2024-09-27 ENCOUNTER — Encounter: Payer: Self-pay | Admitting: Physician Assistant

## 2024-10-05 DIAGNOSIS — M810 Age-related osteoporosis without current pathological fracture: Secondary | ICD-10-CM | POA: Diagnosis not present

## 2024-10-14 ENCOUNTER — Inpatient Hospital Stay: Admission: RE | Admit: 2024-10-14 | Discharge: 2024-10-14 | Attending: Family Medicine | Admitting: Family Medicine

## 2024-10-14 DIAGNOSIS — M7989 Other specified soft tissue disorders: Secondary | ICD-10-CM

## 2024-10-14 DIAGNOSIS — R413 Other amnesia: Secondary | ICD-10-CM

## 2024-10-14 MED ORDER — GADOPICLENOL 0.5 MMOL/ML IV SOLN
6.0000 mL | Freq: Once | INTRAVENOUS | Status: AC | PRN
  Administered 2024-10-14: 6 mL via INTRAVENOUS

## 2024-10-22 ENCOUNTER — Other Ambulatory Visit: Payer: Self-pay | Admitting: *Deleted

## 2024-10-22 ENCOUNTER — Ambulatory Visit: Attending: Family Medicine

## 2024-10-22 ENCOUNTER — Encounter: Payer: Self-pay | Admitting: *Deleted

## 2024-10-22 DIAGNOSIS — R002 Palpitations: Secondary | ICD-10-CM

## 2024-10-22 NOTE — Progress Notes (Signed)
 ZIO monitor mailed to patients home.

## 2024-10-22 NOTE — Progress Notes (Unsigned)
 Enrolled for [employer] to mail a ZIO XT long term holter monitor to the patients address on file.   EP to read

## 2024-11-24 DIAGNOSIS — R002 Palpitations: Secondary | ICD-10-CM | POA: Diagnosis not present

## 2024-11-27 ENCOUNTER — Encounter (HOSPITAL_BASED_OUTPATIENT_CLINIC_OR_DEPARTMENT_OTHER): Payer: Self-pay

## 2024-11-27 ENCOUNTER — Ambulatory Visit (INDEPENDENT_AMBULATORY_CARE_PROVIDER_SITE_OTHER): Admitting: Cardiology

## 2024-11-27 VITALS — BP 138/80 | HR 60 | Ht 63.0 in | Wt 122.7 lb

## 2024-11-27 DIAGNOSIS — Z712 Person consulting for explanation of examination or test findings: Secondary | ICD-10-CM | POA: Diagnosis not present

## 2024-11-27 DIAGNOSIS — I4892 Unspecified atrial flutter: Secondary | ICD-10-CM | POA: Diagnosis not present

## 2024-11-27 DIAGNOSIS — I480 Paroxysmal atrial fibrillation: Secondary | ICD-10-CM | POA: Diagnosis not present

## 2024-11-27 DIAGNOSIS — Z7189 Other specified counseling: Secondary | ICD-10-CM | POA: Diagnosis not present

## 2024-11-27 DIAGNOSIS — D6869 Other thrombophilia: Secondary | ICD-10-CM

## 2024-11-27 DIAGNOSIS — R002 Palpitations: Secondary | ICD-10-CM | POA: Diagnosis not present

## 2024-11-27 NOTE — Patient Instructions (Addendum)
 [email or web address]  Medication Instructions:  No changes today *If you need a refill on your cardiac medications before your next appointment, please call your pharmacy*  Lab Work: none  Testing/Procedures: Your physician has requested that you have an echocardiogram. Echocardiography is a painless test that uses sound waves to create images of your heart. It provides your doctor with information about the size and shape of your heart and how well your hearts chambers and valves are working. This procedure takes approximately one hour. There are no restrictions for this procedure. Please do NOT wear cologne, perfume, aftershave, or lotions (deodorant is allowed). Please arrive 15 minutes prior to your appointment time.  Please note: We ask at that you not bring children with you during ultrasound (echo/ vascular) testing. Due to room size and safety concerns, children are not allowed in the ultrasound rooms during exams. Our front office staff cannot provide observation of children in our lobby area while testing is being conducted. An adult accompanying a patient to their appointment will only be allowed in the ultrasound room at the discretion of the ultrasound technician under special circumstances. We apologize for any inconvenience.   Follow-Up: At [facility], you and your health needs are our priority.  As part of our continuing mission to provide you with exceptional heart care, our providers are all part of one team.  This team includes your primary Cardiologist (physician) and Advanced Practice Providers or APPs (Physician Assistants and Nurse Practitioners) who all work together to provide you with the care you need, when you need it.  Your next appointment:   12 month(s)  Provider:   Shelda Bruckner, MD, Rosaline Bane, NP, or Reche Finder, NP

## 2024-12-05 ENCOUNTER — Ambulatory Visit

## 2024-12-05 ENCOUNTER — Ambulatory Visit: Admitting: Physician Assistant

## 2024-12-30 ENCOUNTER — Other Ambulatory Visit (HOSPITAL_BASED_OUTPATIENT_CLINIC_OR_DEPARTMENT_OTHER)
